# Patient Record
Sex: Female | Born: 1975 | Race: White | Hispanic: No | Marital: Single | Smoking: Current every day smoker
Health system: Southern US, Community
[De-identification: ages and names within clinical notes are randomized; demographics above are authoritative.]

## PROBLEM LIST (undated history)

## (undated) DIAGNOSIS — M1990 Unspecified osteoarthritis, unspecified site: Secondary | ICD-10-CM

## (undated) HISTORY — DX: Unspecified osteoarthritis, unspecified site: M19.90

---

## 2019-07-19 ENCOUNTER — Emergency Department
Admission: EM | Admit: 2019-07-19 | Discharge: 2019-07-19 | Disposition: A | Discharge status: Home / Self Care | Payer: Self-pay | Attending: Student | Admitting: Student

## 2019-07-19 ENCOUNTER — Other Ambulatory Visit: Payer: Self-pay

## 2019-07-19 ENCOUNTER — Emergency Department: Payer: Self-pay

## 2019-07-19 DIAGNOSIS — M79672 Pain in left foot: Secondary | ICD-10-CM | POA: Insufficient documentation

## 2019-07-19 DIAGNOSIS — F17200 Nicotine dependence, unspecified, uncomplicated: Secondary | ICD-10-CM | POA: Insufficient documentation

## 2019-07-19 DIAGNOSIS — M79671 Pain in right foot: Secondary | ICD-10-CM | POA: Insufficient documentation

## 2019-07-19 DIAGNOSIS — M545 Low back pain: Secondary | ICD-10-CM | POA: Insufficient documentation

## 2019-07-19 MED ORDER — IBUPROFEN 600 MG PO TABS: 600.0000 mg | ORAL_TABLET | Freq: Four times a day (QID) | ORAL | 0 refills | Status: AC | PRN

## 2019-07-19 MED ORDER — METHOCARBAMOL 500 MG PO TABS: 500.0000 mg | ORAL_TABLET | Freq: Four times a day (QID) | ORAL | 0 refills | Status: AC | PRN

## 2019-07-19 MED ORDER — OXYCODONE HCL 5 MG PO TABS
5.0000 mg | ORAL_TABLET | Freq: Once | ORAL | Status: AC
  Administered 2019-07-19: 5 mg via ORAL
  Filled 2019-07-19: qty 1

## 2019-07-19 MED ORDER — METHOCARBAMOL 500 MG PO TABS
500.0000 mg | ORAL_TABLET | Freq: Once | ORAL | Status: AC
  Administered 2019-07-19: 500 mg via ORAL
  Filled 2019-07-19: qty 1

## 2019-07-19 MED ORDER — KETOROLAC TROMETHAMINE 30 MG/ML IJ SOLN
30.0000 mg | Freq: Once | INTRAMUSCULAR | Status: DC
  Filled 2019-07-19: qty 1

## 2019-07-19 NOTE — ED Provider Notes (Addendum)
[facility] Emergency Department Provider Note  ____________________________________________   First MD Initiated Contact with Patient 07/19/19 [phone]     (approximate)  I have reviewed the triage vital signs and the nursing notes.  History  Chief Complaint Foot Pain    HPI Jenna Howell is a 44 y.o. female with history of arthritis and degenerative disc disease, who presents to the emergency department for bilateral foot pain.  Patient states she has been diagnosed with an abnormal disease related to her [place] descent, in which she develops firm growths on the bottom of her feet.  She states she was previously managed with a combination of Percocet and Soma, but her primary PCP passed away a year ago, so she has been off of them for some time.  For the last year she has been managing it herself with over-the-counter medications.  Today she felt like her pain became so severe and caused her to have restless legs bilaterally, which prompted her to seek care. The pain radiates up both legs and into her lower back. Aching, sharp pain, 8/10 in severity. Improved w/ rest. Worsened w/ pressure on her feet. She denies any direct trauma.  Does feel like the top of her feet are more swollen than normal.  No erythema or warmth.  No numbness or tingling.    Past Medical Hx Past Medical History:  Diagnosis Date  . Arthritis   . DJD (degenerative joint disease)     Problem List There are no problems to display for this patient.   Past Surgical Hx History reviewed. No pertinent surgical history.  Medications Prior to Admission medications   Not on File    Allergies Penicillin g, Penicillins, Promethazine, and Toradol [ketorolac tromethamine]  Family Hx No family history on file.  Social Hx Social History   Tobacco Use  . Smoking status: Current Every Day Smoker  . Smokeless tobacco: Never Used  Substance Use Topics  . Alcohol use: Never  . Drug use:  Never     Review of Systems  Constitutional: Negative for fever. Negative for chills. Eyes: Negative for visual changes. ENT: Negative for sore throat. Cardiovascular: Negative for chest pain. Respiratory: Negative for shortness of breath. Gastrointestinal: Negative for nausea. Negative for vomiting.  Genitourinary: Negative for dysuria. Musculoskeletal: + bilateral foot pain Skin: Negative for rash. Neurological: Negative for headaches.   Physical Exam  Vital Signs: ED Triage Vitals  Enc Vitals Group     BP --      Pulse Rate 07/19/19 0235 99     Resp 07/19/19 0235 18     Temp 07/19/19 0235 98.7 F (37.1 C)     Temp Source 07/19/19 0235 Oral     SpO2 07/19/19 0235 100 %     Weight --      Height --      Head Circumference --      Peak Flow --      Pain Score 07/19/19 0236 8     Pain Loc --      Pain Edu? --      Excl. in GC? --     Constitutional: Alert and oriented. Well appearing. NAD.  Head: Normocephalic. Atraumatic. Eyes: Conjunctivae clear. Sclera anicteric. Pupils equal and symmetric. Nose: No masses or lesions. No congestion or rhinorrhea. Mouth/Throat: Wearing mask.  Neck: No stridor. Trachea midline.  Cardiovascular: Normal rate, regular rhythm. Extremities well perfused. 2+ symmetric DP pulses. Toes WWP.  Respiratory: Normal respiratory effort.  Genitourinary: Deferred. Musculoskeletal: BLE: mild swelling to the top of both feet, no significant edema, no asymmetry. No erythema or warmth. Able to wiggle toes and FROM at ankle bilaterally. Does have palpable firm growths to the bottom of both feet. No fluctuance. No crepitance.  Neurologic:  Normal speech and language. No gross focal or lateralizing neurologic deficits are appreciated.  Skin: Skin is warm, dry and intact. No rash noted. Psychiatric: Mood and affect are appropriate for situation.    Radiology  Personally reviewed available imaging myself.   XR foot LEFT - IMPRESSION:  No acute  finding.  Small heel spurs.   XR foot RIGHT - IMPRESSION:  Plantar heel spur.    Procedures  Procedure(s) performed (including critical care):  Procedures   Initial Impression / Assessment and Plan / MDM / ED Course  44 y.o. female who presents to the ED for chronic bilateral foot pain, as above.  Patient states she has been diagnosed with an abnormal disease related to her [place] descent, in which she develops firm growths on the bottom of her feet.   Exam as above, she does have firm, palpable growths to the dorsum of both feet.  There is no associated erythema or fluctuance.  These do not appear to be abscesses or areas of infection.  She has full range of motion at her ankle without pain or discomfort, do not suspect infected joint.  Distally neurovascularly intact. No asynmetric swelling or edema, do not suspect VTE. Differential includes neuroma vs fibroma vs other soft tissue growth vs bone spur.  XR imaging without acute findings, notable for some heel spurs.  As such, feel patient is stable for discharge with outpatient follow-up.  Will plan for discharge with Rx for supportive care and podiatry follow-up.  _______________________________   As part of my medical decision making I have reviewed available labs, radiology tests, reviewed old records/performed chart review.    Final Clinical Impression(s) / ED Diagnosis  Bilateral foot pain   Note:  This document was prepared using Dragon voice recognition software and may include unintentional dictation errors.     Miguel Aschoff., MD 07/19/19 [phone]

## 2019-07-19 NOTE — Discharge Instructions (Addendum)
Thank you for letting us take care of you in the emergency department today. Your x-rays did not show any breaks or fractures  New medications we have prescribed:  Robaxin, muscle relaxer Ibuprofen, for pain and inflammation  Please follow up with: Podiatry doctor - foot specialist, information is below A primary care doctor to review your ER visit and follow up on your symptoms. Information for a general clinic is below.  Please return to the ER for any new or worsening symptoms.

## 2019-07-19 NOTE — ED Notes (Signed)
Pt brought back into lobby by family via wheelchair. NAD at this time.

## 2019-07-19 NOTE — ED Notes (Signed)
Pt ambulatory to front desk reporting pain in her foot. Pt reports dealing with a chronic mass on her foot that has worsened recently. Pt reports the mass feels like it is having a "heartbeat"   Pt reports the pain has caused her to have symptoms "like restless legs" and she has been unable to sleep due to the pain.

## 2019-07-19 NOTE — ED Notes (Signed)
E-signature not working at this time. Pt verbalized understanding of D/C instructions, prescriptions and follow up care with no further questions at this time. Pt in NAD and ambulatory at time of D/C.  

## 2019-07-19 NOTE — ED Notes (Signed)
Dr. Colon Branch informed patient refused Toradol d/t causing itching.  Added to patients allergies

## 2019-07-19 NOTE — ED Triage Notes (Addendum)
Pt has pain in the right foot due to a non cancerous nodule due to her [place] descent. Pt does not know the name. Possible Dupuytren contracture. Pt continues and is c/io pain in various places, back, legs. Later pt reports restless leg syndrome.

## 2019-07-20 ENCOUNTER — Telehealth: Payer: Self-pay | Admitting: General Practice

## 2019-07-20 NOTE — Telephone Encounter (Signed)
Individual has been contacted and informed they are not eligible to become a pt in our clinic due to county of residence.

## 2021-06-17 IMAGING — DX DG FOOT COMPLETE 3+V*L*
3 series · 3 of 3 positions shown · non-contrast
Comparison: None.

CLINICAL DATA: Foot pain and plantar firm gross.

EXAM:
LEFT FOOT - COMPLETE 3+ VIEW

[foot ap]
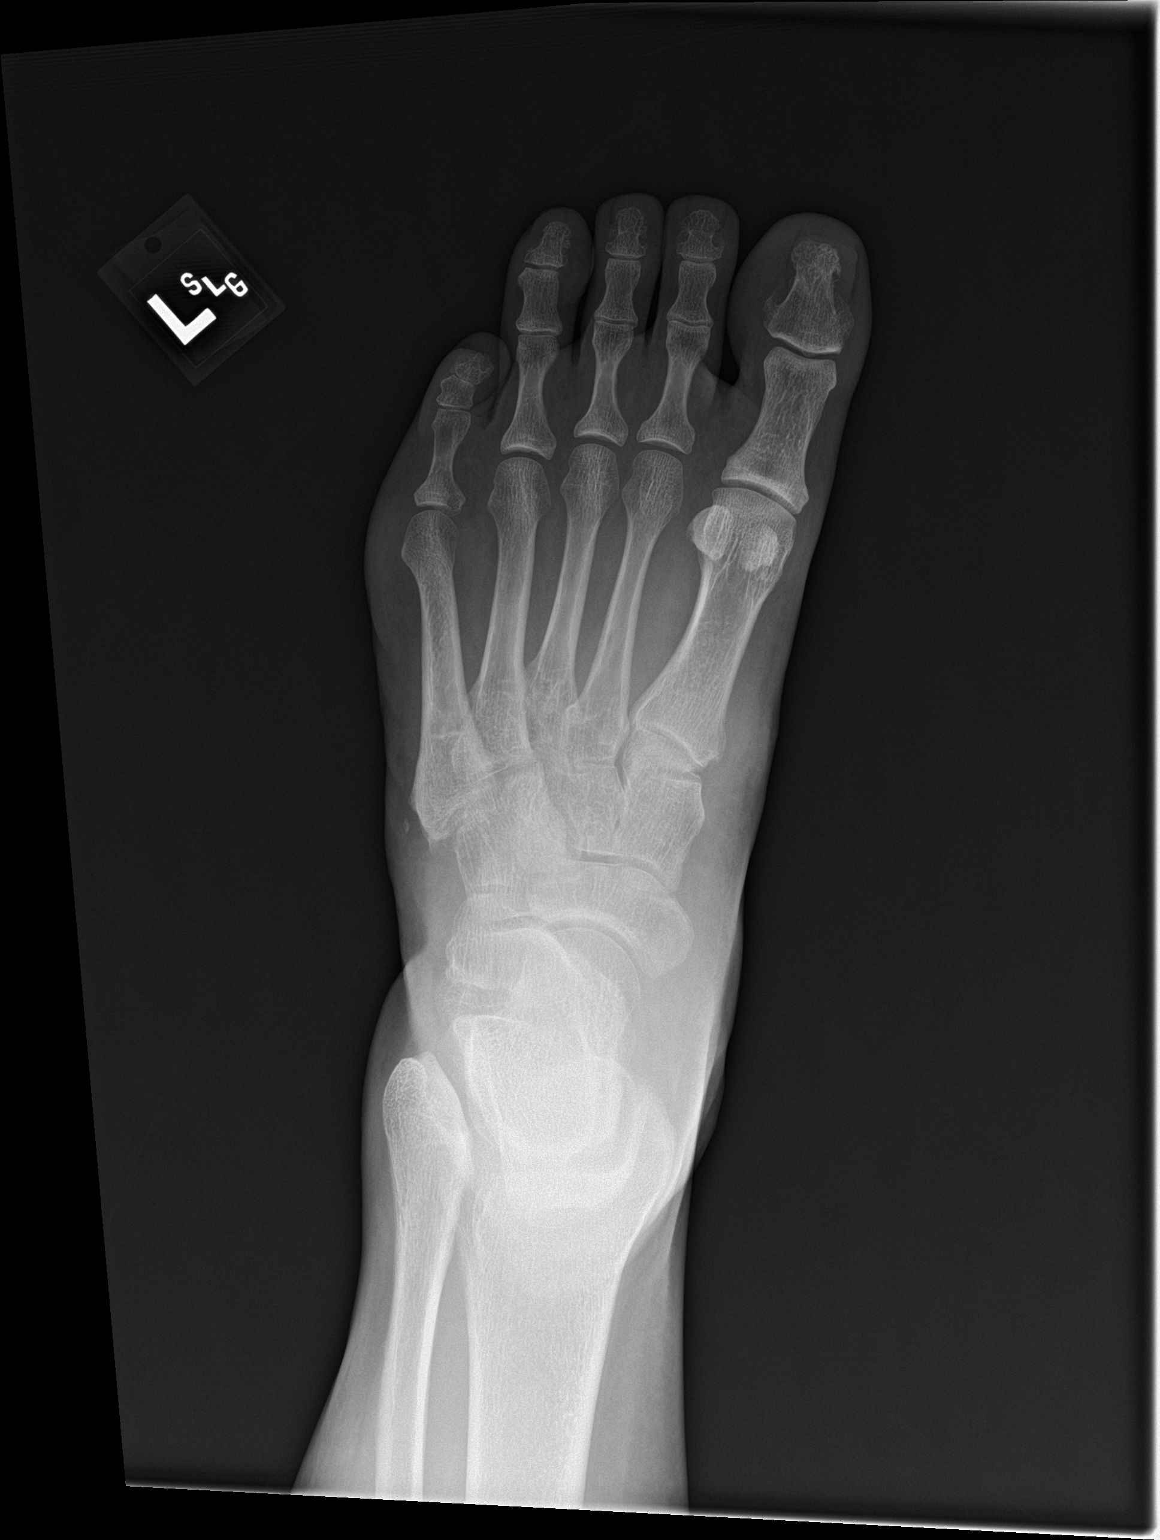

[foot obl]
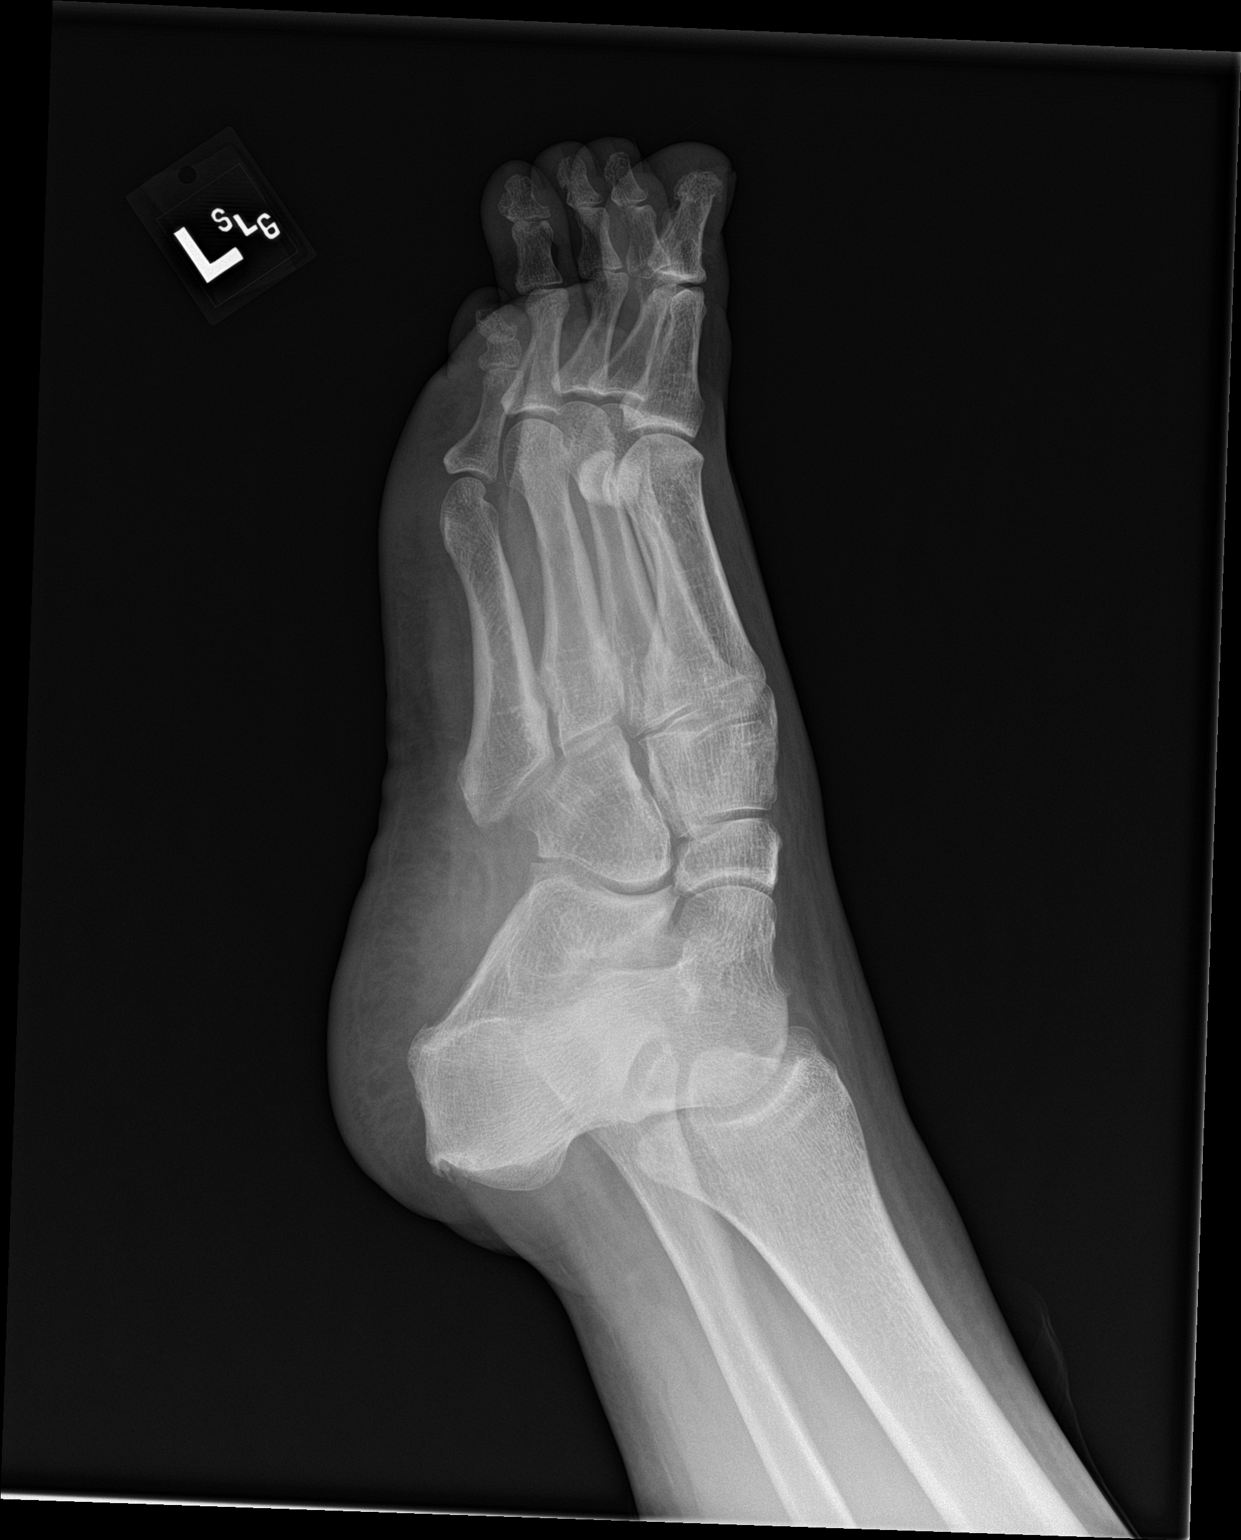

[foot lat]
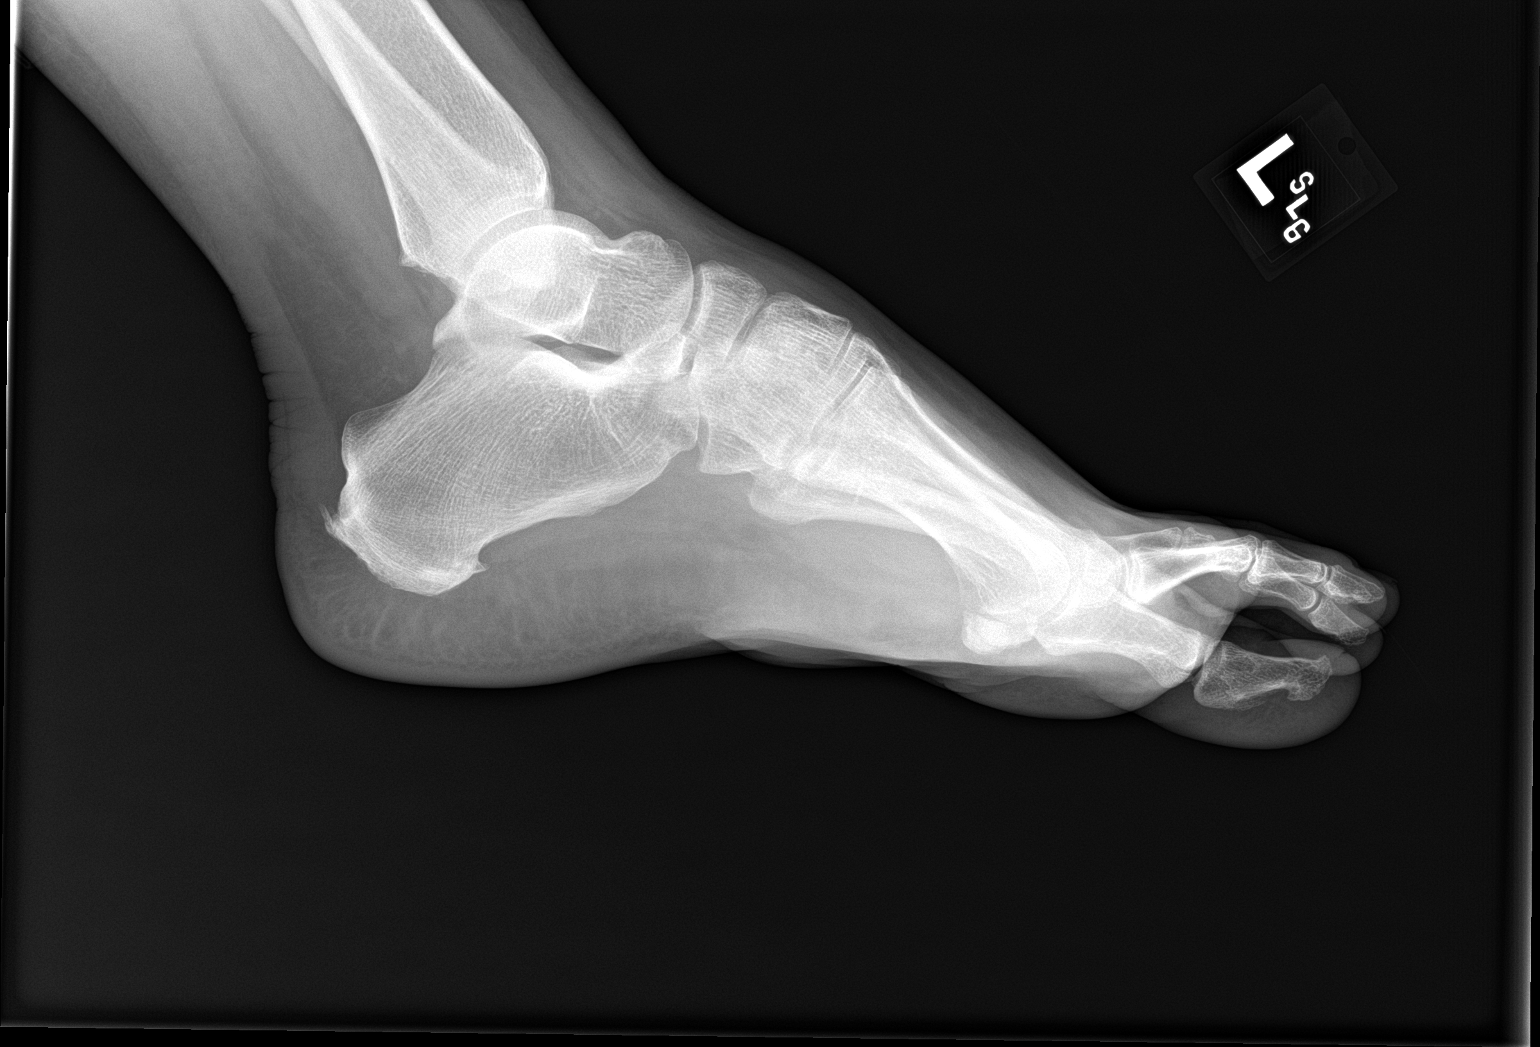

[3 of 3 positions shown; findings below may reference images not displayed]

FINDINGS: There is no evidence of fracture or dislocation. No evidence of
arthropathy. Tiny heel spurs. Unremarkable soft tissues.
IMPRESSION: No acute finding.

Small heel spurs.

## 2021-06-17 IMAGING — DX DG FOOT COMPLETE 3+V*R*
3 series · 3 of 3 positions shown · non-contrast
Comparison: None.

CLINICAL DATA: Foot pain and firm plantar growths.

EXAM:
RIGHT FOOT COMPLETE - 3+ VIEW

[foot ap]
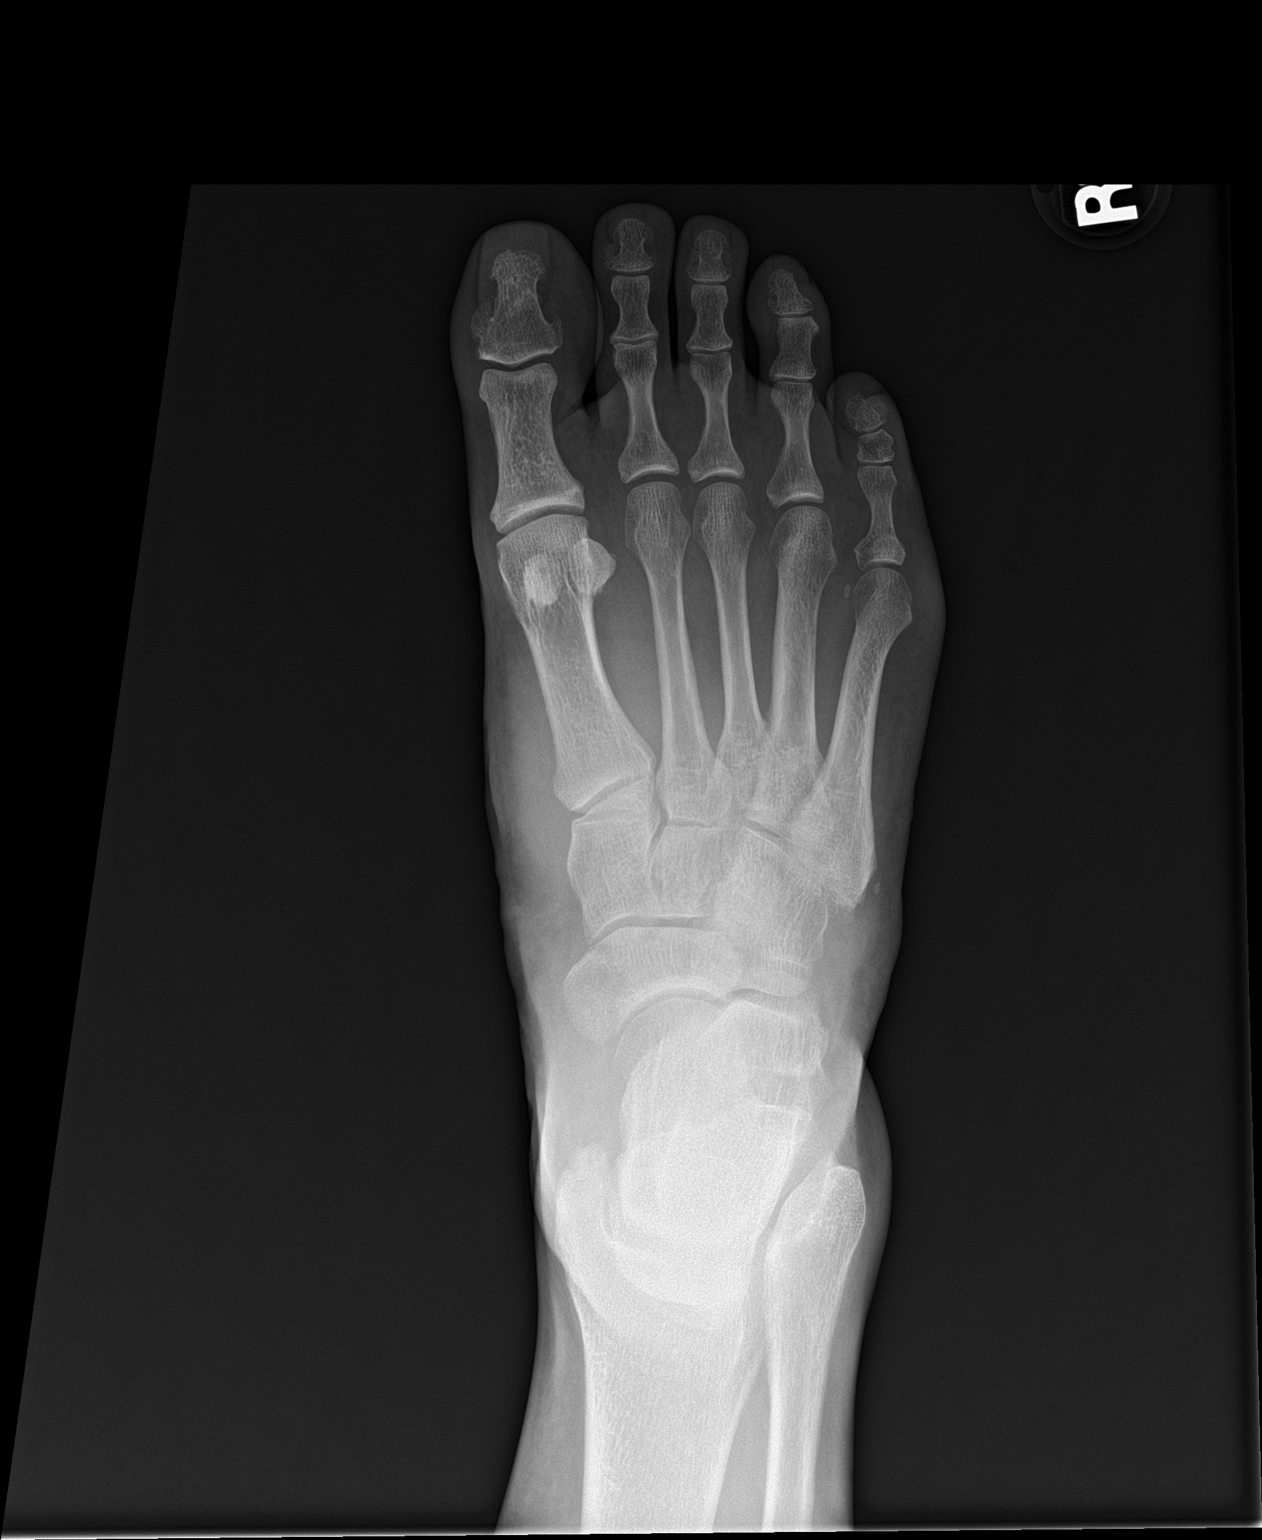

[foot obl]
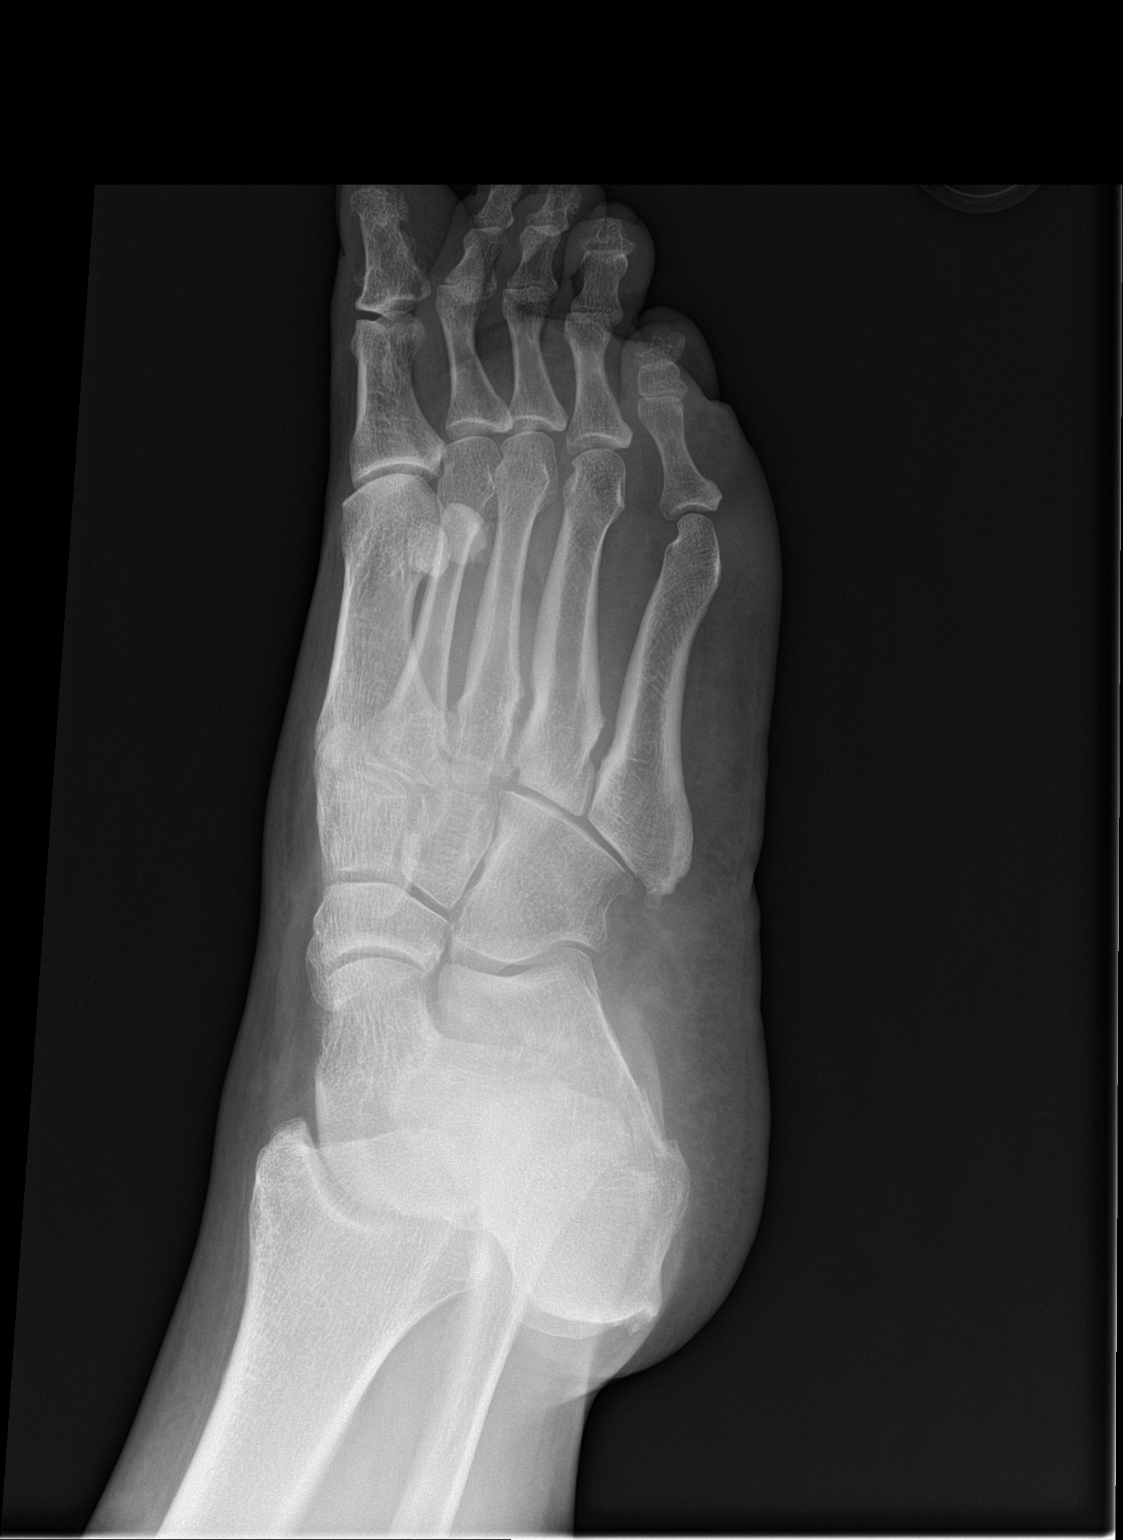

[foot lat]
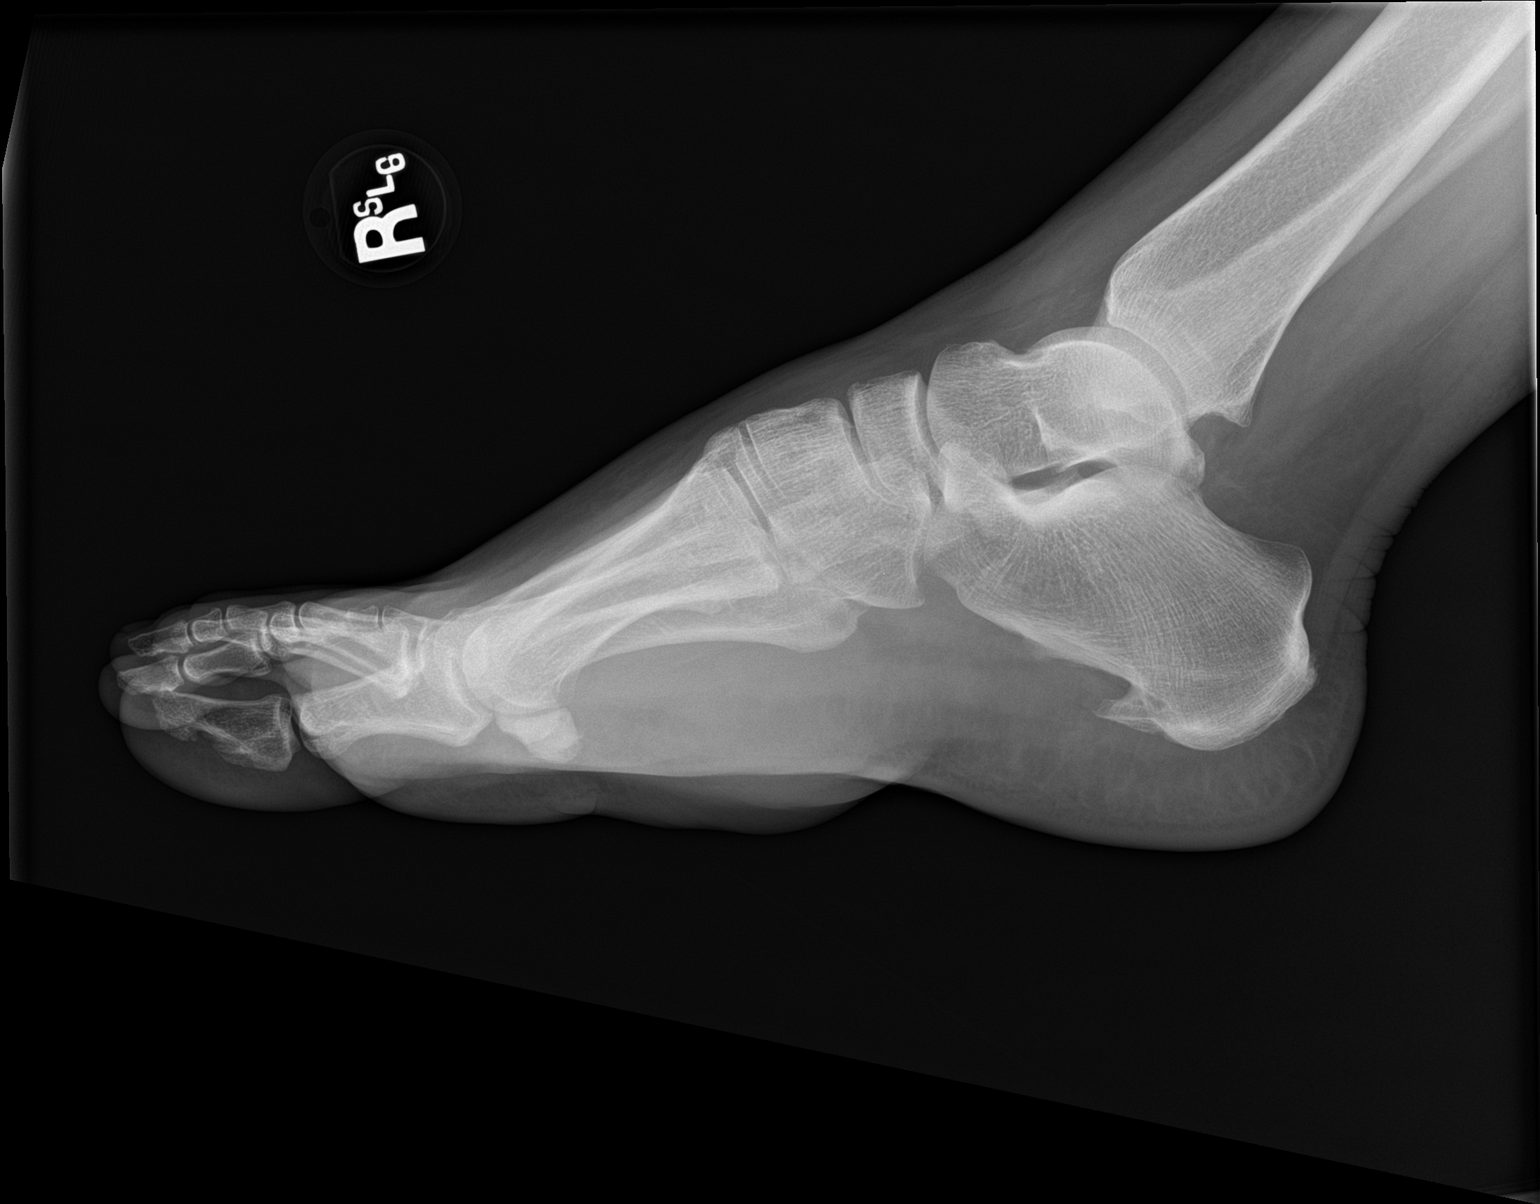

[3 of 3 positions shown; findings below may reference images not displayed]

FINDINGS: Non eroded heel spur. No soft tissue findings. No evidence of
fracture, bone lesion, or erosion
IMPRESSION: Plantar heel spur.

## 2021-06-19 DEATH — deceased
